# Patient Record
Sex: Male | Born: 1995 | Hispanic: No | Marital: Single
Health system: Northeastern US, Academic
[De-identification: ages and names within clinical notes are randomized; demographics above are authoritative.]

## PROBLEM LIST (undated history)

## (undated) DIAGNOSIS — K219 Gastro-esophageal reflux disease without esophagitis: Secondary | ICD-10-CM

---
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
## 2018-07-12 ENCOUNTER — Other Ambulatory Visit: Payer: Self-pay

## 2018-07-12 ENCOUNTER — Emergency Department: Payer: 59

## 2018-07-12 ENCOUNTER — Emergency Department
Admission: EM | Admit: 2018-07-12 | Discharge: 2018-07-12 | Disposition: A | Discharge status: Home / Self Care | Payer: 59 | Attending: Emergency Medicine | Admitting: Emergency Medicine

## 2018-07-12 ENCOUNTER — Encounter: Payer: Self-pay | Admitting: Emergency Medicine

## 2018-07-12 DIAGNOSIS — S8011XA Contusion of right lower leg, initial encounter: Secondary | ICD-10-CM | POA: Diagnosis not present

## 2018-07-12 DIAGNOSIS — S8991XA Unspecified injury of right lower leg, initial encounter: Secondary | ICD-10-CM

## 2018-07-12 DIAGNOSIS — Y929 Unspecified place or not applicable: Secondary | ICD-10-CM | POA: Diagnosis not present

## 2018-07-12 DIAGNOSIS — S90511A Abrasion, right ankle, initial encounter: Secondary | ICD-10-CM | POA: Insufficient documentation

## 2018-07-12 DIAGNOSIS — Y939 Activity, unspecified: Secondary | ICD-10-CM | POA: Diagnosis not present

## 2018-07-12 DIAGNOSIS — Z79899 Other long term (current) drug therapy: Secondary | ICD-10-CM | POA: Insufficient documentation

## 2018-07-12 DIAGNOSIS — W208XXA Other cause of strike by thrown, projected or falling object, initial encounter: Secondary | ICD-10-CM | POA: Insufficient documentation

## 2018-07-12 DIAGNOSIS — Y999 Unspecified external cause status: Secondary | ICD-10-CM | POA: Diagnosis not present

## 2018-07-12 DIAGNOSIS — F17210 Nicotine dependence, cigarettes, uncomplicated: Secondary | ICD-10-CM | POA: Diagnosis not present

## 2018-07-12 MED ORDER — BACITRACIN-NEOMYCIN-POLYMYXIN 400-5-5000 EX OINT
TOPICAL_OINTMENT | Freq: Once | CUTANEOUS | Status: AC
  Administered 2018-07-12: 1 via TOPICAL
  Filled 2018-07-12: qty 1

## 2018-07-12 MED ORDER — IBUPROFEN 600 MG PO TABS: 600.0000 mg | ORAL_TABLET | Freq: Four times a day (QID) | ORAL | 0 refills | Status: AC | PRN

## 2018-07-12 NOTE — ED Triage Notes (Signed)
Pt reports wood platform fell on leg this morning at 230 am.  Was drinking at time but denies LOC.  Increased pain with ambulation.  Abrasion to right ankle, foot, calf noted along with bruising and swelling.

## 2018-07-12 NOTE — Discharge Instructions (Signed)
There is nothing broken on your x-ray.  Please wear Ace wrap and use crutches over the weekend.  Follow-up with [facility] health clinic or primary care early next week for reevaluation.  You can take ibuprofen for pain.  Please apply Neosporin to abrasions and keep covered.

## 2018-07-12 NOTE — ED Notes (Signed)
Ace wrap and crutches given by tech.

## 2018-07-12 NOTE — ED Provider Notes (Signed)
[facility] Emergency Department Provider Note  ____________________________________________  Time seen: Approximately 12:17 PM  I have reviewed the triage vital signs and the nursing notes.   HISTORY  Chief Complaint Leg Injury    HPI Luis Stephenson is a 23 y.o. male that presents to the emergency department for evaluation of right ankle and calf pain after a wooden platform fell on his leg about 10 hours ago.  He has had pain with walking since.  Patient is a Consulting civil engineer at [employer] and is UTD on vaccinations.  No additional injuries.  No numbness, tingling.   History reviewed. No pertinent past medical history.  Patient Active Problem List   Diagnosis Date Noted  . Cough 06/22/2016  . Sinus congestion 06/22/2016    History reviewed. No pertinent surgical history.  Prior to Admission medications   Medication Sig Start Date End Date Taking? Authorizing Provider  ibuprofen (ADVIL,MOTRIN) 600 MG tablet Take 1 tablet (600 mg total) by mouth every 6 (six) hours as needed. 07/12/18   Enid Derry, PA-C  ranitidine (ZANTAC) 150 MG capsule Take 150 mg by mouth every evening.    [provider]    Allergies Patient has no known allergies.  History reviewed. No pertinent family history.  Social History Social History   Tobacco Use  . Smoking status: Light Tobacco Smoker    Types: Cigarettes  . Smokeless tobacco: Never Used  . Tobacco comment: social smoker  Substance Use Topics  . Alcohol use: Yes    Comment: every other weekend  . Drug use: Yes    Types: Marijuana     Review of Systems  Respiratory: No SOB. Gastrointestinal: No abdominal pain.  No nausea, no vomiting.  Musculoskeletal: Positive for leg and ankle pain. Skin: Negative for lacerations, ecchymosis. Positive for rash and abrasions.  Neurological: Negative for headaches, numbness or tingling   ____________________________________________   PHYSICAL EXAM:  VITAL SIGNS: ED  Triage Vitals [07/12/18 1140]  Enc Vitals Group     BP (!) 152/73     Pulse Rate (!) 103     Resp 16     Temp 97.9 F (36.6 C)     Temp Source Oral     SpO2 98 %     Weight 168 lb 8 oz (76.4 kg)     Height 5\' 11"  (1.803 m)     Head Circumference      Peak Flow      Pain Score 3     Pain Loc      Pain Edu?      Excl. in GC?      Constitutional: Alert and oriented. Well appearing and in no acute distress. Eyes: Conjunctivae are normal. PERRL. EOMI. Head: Atraumatic. ENT:      Ears:      Nose: No congestion/rhinnorhea.      Mouth/Throat: Mucous membranes are moist.  Neck: No stridor.   Cardiovascular: Normal rate, regular rhythm.  Good peripheral circulation. Respiratory: Normal respiratory effort without tachypnea or retractions. Lungs CTAB. Good air entry to the bases with no decreased or absent breath sounds. Musculoskeletal: Full range of motion to all extremities. No gross deformities appreciated. Negative thompson test.  Neurologic:  Normal speech and language. No gross focal neurologic deficits are appreciated.  Skin:  Skin is warm, dry. Abrasions and ecchymosis to distal calf.  Psychiatric: Mood and affect are normal. Speech and behavior are normal. Patient exhibits appropriate insight and judgement.   ____________________________________________   LABS (all  labs ordered are listed, but only abnormal results are displayed)  Labs Reviewed - No data to display ____________________________________________  EKG   ____________________________________________  RADIOLOGY Lexine Baton, personally viewed and evaluated these images (plain radiographs) as part of my medical decision making, as well as reviewing the written report by the radiologist.  Dg Tibia/fibula Right  Result Date: 07/12/2018 CLINICAL DATA:  Recent fall.  Posterior ankle pain. EXAM: RIGHT TIBIA AND FIBULA - 2 VIEW COMPARISON:  Right ankle 07/12/2018 FINDINGS: Right tibia and fibula are intact.  Ankle and knee are located. Mild irregularity along the plantar aspect of the calcaneus but no definite fracture. No focal soft tissue abnormality. IMPRESSION: No acute bone abnormality in the right lower leg. Electronically Signed   By: Richarda Overlie M.D.   On: 07/12/2018 13:01   Dg Ankle Complete Right  Result Date: 07/12/2018 CLINICAL DATA:  Status post fall with ankle pain. EXAM: RIGHT ANKLE - COMPLETE 3+ VIEW COMPARISON:  None. FINDINGS: There is no evidence of fracture, dislocation, or joint effusion. There is no evidence of arthropathy or other focal bone abnormality. Soft tissues are unremarkable. IMPRESSION: Negative. Electronically Signed   By: Ted Mcalpine M.D.   On: 07/12/2018 12:59    ____________________________________________    PROCEDURES  Procedure(s) performed:    Procedures    Medications  neomycin-bacitracin-polymyxin (NEOSPORIN) ointment packet (1 application Topical Given 07/12/18 1330)     ____________________________________________   INITIAL IMPRESSION / ASSESSMENT AND PLAN / ED COURSE  Pertinent labs & imaging results that were available during my care of the patient were reviewed by me and considered in my medical decision making (see chart for details).  Review of the [place] CSRS was performed in accordance of the [facility] prior to dispensing any controlled drugs.   Patient's diagnosis is consistent with patient presented to emergency department for evaluation of lower leg pain after injury last night.  Vital signs and exam are reassuring.  X-rays are negative for acute bony abnormalities.  Symptoms are consistent with contusion and abrasion.  Leg was Ace wrapped.  Crutches were given.  Patient will be discharged home with prescriptions for ibuprofen. Patient is to follow up with primary care as directed. Patient is given ED precautions to return to the ED for any worsening or new symptoms.     ____________________________________________  FINAL  CLINICAL IMPRESSION(S) / ED DIAGNOSES  Final diagnoses:  Injury of right lower extremity, initial encounter  Contusion of right lower leg, initial encounter      NEW MEDICATIONS STARTED DURING THIS VISIT:  ED Discharge Orders         Ordered    ibuprofen (ADVIL,MOTRIN) 600 MG tablet  Every 6 hours PRN     07/12/18 1321              This chart was dictated using voice recognition software/Dragon. Despite best efforts to proofread, errors can occur which can change the meaning. Any change was purely unintentional.    Enid Derry, PA-C 07/12/18 1437    Minna Antis, MD 07/12/18 [phone]

## 2018-10-28 ENCOUNTER — Emergency Department: Payer: 59

## 2018-10-28 ENCOUNTER — Encounter: Payer: Self-pay | Admitting: Emergency Medicine

## 2018-10-28 ENCOUNTER — Emergency Department
Admission: EM | Admit: 2018-10-28 | Discharge: 2018-10-28 | Disposition: A | Discharge status: Home / Self Care | Payer: 59 | Attending: Student in an Organized Health Care Education/Training Program | Admitting: Student in an Organized Health Care Education/Training Program

## 2018-10-28 ENCOUNTER — Other Ambulatory Visit: Payer: Self-pay

## 2018-10-28 DIAGNOSIS — F17210 Nicotine dependence, cigarettes, uncomplicated: Secondary | ICD-10-CM | POA: Insufficient documentation

## 2018-10-28 DIAGNOSIS — Z20828 Contact with and (suspected) exposure to other viral communicable diseases: Secondary | ICD-10-CM | POA: Insufficient documentation

## 2018-10-28 DIAGNOSIS — R0602 Shortness of breath: Secondary | ICD-10-CM | POA: Diagnosis not present

## 2018-10-28 DIAGNOSIS — R0789 Other chest pain: Secondary | ICD-10-CM | POA: Insufficient documentation

## 2018-10-28 DIAGNOSIS — Z79899 Other long term (current) drug therapy: Secondary | ICD-10-CM | POA: Diagnosis not present

## 2018-10-28 LAB — CBC WITH DIFFERENTIAL/PLATELET
Abs Immature Granulocytes: 0.01 10*3/uL (ref 0.00–0.07)
Basophils Absolute: 0 10*3/uL (ref 0.0–0.1)
Basophils Relative: 1 %
Eosinophils Absolute: 0 10*3/uL (ref 0.0–0.5)
Eosinophils Relative: 1 %
HCT: 46.2 % (ref 39.0–52.0)
Hemoglobin: 16 g/dL (ref 13.0–17.0)
Immature Granulocytes: 0 %
Lymphocytes Relative: 38 %
Lymphs Abs: 2.2 10*3/uL (ref 0.7–4.0)
MCH: 30.9 pg (ref 26.0–34.0)
MCHC: 34.6 g/dL (ref 30.0–36.0)
MCV: 89.4 fL (ref 80.0–100.0)
Monocytes Absolute: 0.5 10*3/uL (ref 0.1–1.0)
Monocytes Relative: 8 %
Neutro Abs: 3.1 10*3/uL (ref 1.7–7.7)
Neutrophils Relative %: 52 %
Platelets: 225 10*3/uL (ref 150–400)
RBC: 5.17 MIL/uL (ref 4.22–5.81)
RDW: 12.1 % (ref 11.5–15.5)
WBC: 5.9 10*3/uL (ref 4.0–10.5)
nRBC: 0 % (ref 0.0–0.2)

## 2018-10-28 LAB — COMPREHENSIVE METABOLIC PANEL
ALT: 45 U/L — ABNORMAL HIGH (ref 0–44)
AST: 35 U/L (ref 15–41)
Albumin: 4.5 g/dL (ref 3.5–5.0)
Alkaline Phosphatase: 55 U/L (ref 38–126)
Anion gap: 9 (ref 5–15)
BUN: 12 mg/dL (ref 6–20)
CO2: 23 mmol/L (ref 22–32)
Calcium: 9.3 mg/dL (ref 8.9–10.3)
Chloride: 107 mmol/L (ref 98–111)
Creatinine, Ser: 0.89 mg/dL (ref 0.61–1.24)
GFR calc Af Amer: >60 mL/min (ref >60)
GFR calc non Af Amer: >60 mL/min (ref >60)
Glucose, Bld: 92 mg/dL (ref 70–99)
Potassium: 3.6 mmol/L (ref 3.5–5.1)
Sodium: 139 mmol/L (ref 135–145)
Total Bilirubin: 1.1 mg/dL (ref 0.3–1.2)
Total Protein: 7.6 g/dL (ref 6.5–8.1)

## 2018-10-28 MED ORDER — PREDNISONE 20 MG PO TABS: 40.0000 mg | ORAL_TABLET | Freq: Every day | ORAL | 0 refills | Status: AC

## 2018-10-28 MED ORDER — PREDNISONE 20 MG PO TABS
60.0000 mg | ORAL_TABLET | Freq: Once | ORAL | Status: AC
  Administered 2018-10-28: 60 mg via ORAL
  Filled 2018-10-28: qty 3

## 2018-10-28 NOTE — ED Provider Notes (Signed)
[facility] Emergency Department Provider Note    First MD Initiated Contact with Patient 10/28/18 1355     (approximate)  I have reviewed the triage vital signs and the nursing notes.   HISTORY  Chief Complaint Shortness of Breath    HPI Luis Stephenson is a 23 y.o. male presents ER for evaluation of left-sided chest discomfort and shortness of breath that is been ongoing for quite some time but became acutely worse today.  States he does have a history of asthma and took albuterol inhalers as a child.  He does vape and smoke.  Denies any recent prolonged travel or immobilization.  No history of blood clots.  No fevers.  Has had some loose stool.  Would like to be tested for coronavirus.    History reviewed. No pertinent past medical history. No family history on file. History reviewed. No pertinent surgical history. Patient Active Problem List   Diagnosis Date Noted  . Cough 06/22/2016  . Sinus congestion 06/22/2016      Prior to Admission medications   Medication Sig Start Date End Date Taking? Authorizing Provider  ibuprofen (ADVIL,MOTRIN) 600 MG tablet Take 1 tablet (600 mg total) by mouth every 6 (six) hours as needed. 07/12/18   Enid DerryWagner, Ashley, PA-C  predniSONE (DELTASONE) 20 MG tablet Take 2 tablets (40 mg total) by mouth daily for 5 days. 10/28/18 11/02/18  Willy Eddyobinson, Lakyn Alsteen, MD  ranitidine (ZANTAC) 150 MG capsule Take 150 mg by mouth every evening.    [provider]    Allergies Patient has no known allergies.    Social History Social History   Tobacco Use  . Smoking status: Light Tobacco Smoker    Types: Cigarettes  . Smokeless tobacco: Never Used  . Tobacco comment: social smoker  Substance Use Topics  . Alcohol use: Yes    Comment: every other weekend  . Drug use: Yes    Types: Marijuana    Review of Systems Patient denies headaches, rhinorrhea, blurry vision, numbness, shortness of breath, chest pain, edema, cough,  abdominal pain, nausea, vomiting, diarrhea, dysuria, fevers, rashes or hallucinations unless otherwise stated above in HPI. ____________________________________________   PHYSICAL EXAM:  VITAL SIGNS: Vitals:   10/28/18 1353  BP: (!) 129/96  Pulse: 83  Resp: 16  Temp: 98.2 F (36.8 C)  SpO2: 96%    Constitutional: Alert and oriented.  Eyes: Conjunctivae are normal.  Head: Atraumatic. Nose: No congestion/rhinnorhea. Mouth/Throat: Mucous membranes are moist.   Neck: No stridor. Painless ROM.  Cardiovascular: Normal rate, regular rhythm. Grossly normal heart sounds.  Good peripheral circulation. Respiratory: Normal respiratory effort.  No retractions. Lungs with occasional expiratory wheeze. Gastrointestinal: Soft and nontender. No distention. No abdominal bruits. No CVA tenderness. Genitourinary:  Musculoskeletal: No lower extremity tenderness nor edema.  No joint effusions. Neurologic:  Normal speech and language. No gross focal neurologic deficits are appreciated. No facial droop Skin:  Skin is warm, dry and intact. No rash noted. Psychiatric: Mood and affect are normal. Speech and behavior are normal.  ____________________________________________   LABS (all labs ordered are listed, but only abnormal results are displayed)  Results for orders placed or performed during the hospital encounter of 10/28/18 (from the past 24 hour(s))  CBC with Differential/Platelet     Status: None   Collection Time: 10/28/18  2:30 PM  Result Value Ref Range   WBC 5.9 4.0 - 10.5 K/uL   RBC 5.17 4.22 - 5.81 MIL/uL   Hemoglobin 16.0 13.0 -  17.0 g/dL   HCT 46.2 39.0 - 52.0 %   MCV 89.4 80.0 - 100.0 fL   MCH 30.9 26.0 - 34.0 pg   MCHC 34.6 30.0 - 36.0 g/dL   RDW 12.1 11.5 - 15.5 %   Platelets 225 150 - 400 K/uL   nRBC 0.0 0.0 - 0.2 %   Neutrophils Relative % 52 %   Neutro Abs 3.1 1.7 - 7.7 K/uL   Lymphocytes Relative 38 %   Lymphs Abs 2.2 0.7 - 4.0 K/uL   Monocytes Relative 8 %    Monocytes Absolute 0.5 0.1 - 1.0 K/uL   Eosinophils Relative 1 %   Eosinophils Absolute 0.0 0.0 - 0.5 K/uL   Basophils Relative 1 %   Basophils Absolute 0.0 0.0 - 0.1 K/uL   Immature Granulocytes 0 %   Abs Immature Granulocytes 0.01 0.00 - 0.07 K/uL  Comprehensive metabolic panel     Status: Abnormal   Collection Time: 10/28/18  2:30 PM  Result Value Ref Range   Sodium 139 135 - 145 mmol/L   Potassium 3.6 3.5 - 5.1 mmol/L   Chloride 107 98 - 111 mmol/L   CO2 23 22 - 32 mmol/L   Glucose, Bld 92 70 - 99 mg/dL   BUN 12 6 - 20 mg/dL   Creatinine, Ser 0.89 0.61 - 1.24 mg/dL   Calcium 9.3 8.9 - 10.3 mg/dL   Total Protein 7.6 6.5 - 8.1 g/dL   Albumin 4.5 3.5 - 5.0 g/dL   AST 35 15 - 41 U/L   ALT 45 (H) 0 - 44 U/L   Alkaline Phosphatase 55 38 - 126 U/L   Total Bilirubin 1.1 0.3 - 1.2 mg/dL   GFR calc non Af Amer >60 >60 mL/min   GFR calc Af Amer >60 >60 mL/min   Anion gap 9 5 - 15   ____________________________________________ ____________________________________________  RADIOLOGY  I personally reviewed all radiographic images ordered to evaluate for the above acute complaints and reviewed radiology reports and findings.  These findings were personally discussed with the patient.  Please see medical record for radiology report.  ____________________________________________   PROCEDURES  Procedure(s) performed:  Procedures    Critical Care performed: no ____________________________________________   INITIAL IMPRESSION / ASSESSMENT AND PLAN / ED COURSE  Pertinent labs & imaging results that were available during my care of the patient were reviewed by me and considered in my medical decision making (see chart for details).   DDX: Asthma, bronchitis, PE, pneumothorax, pneumonia, COVID-19, pleurisy  Luis Stephenson is a 23 y.o. who presents to the ED with symptoms as described above.  Patient well-appearing afebrile hemodynamically stable.  No hypoxia.  He is low risk by  Wells criteria and is PERC negative.  Patient blood will be sent to evaluate for any sign of anemia.  Will be treated for bronchitis.  Will also send some of her COVID-19.  Patient stable and appropriate for outpatient follow-up.     The patient was evaluated in Emergency Department today for the symptoms described in the history of present illness. He/she was evaluated in the context of the global COVID-19 pandemic, which necessitated consideration that the patient might be at risk for infection with the SARS-CoV-2 virus that causes COVID-19. Institutional protocols and algorithms that pertain to the evaluation of patients at risk for COVID-19 are in a state of rapid change based on information released by regulatory bodies including the CDC and federal and state organizations. These policies and algorithms were  followed during the patient's care in the ED.  As part of my medical decision making, I reviewed the following data within the electronic MEDICAL RECORD NUMBER Nursing notes reviewed and incorporated, Labs reviewed, notes from prior ED visits and [place] Controlled Substance Database   ____________________________________________   FINAL CLINICAL IMPRESSION(S) / ED DIAGNOSES  Final diagnoses:  SOB (shortness of breath)      NEW MEDICATIONS STARTED DURING THIS VISIT:  New Prescriptions   PREDNISONE (DELTASONE) 20 MG TABLET    Take 2 tablets (40 mg total) by mouth daily for 5 days.     Note:  This document was prepared using Dragon voice recognition software and may include unintentional dictation errors.    Willy Eddyobinson, Nyla Creason, MD 10/28/18 1530

## 2018-10-28 NOTE — ED Triage Notes (Signed)
Pt arrives with complaints of shortness of breath for the last 4 days. Pt reports back pain but reports "it's only there when I breath."

## 2018-10-28 NOTE — Discharge Instructions (Signed)
Your blood work all returned normal.  Your chest x-ray shows no evidence of pneumonia.  Your coronavirus test showed result in 1 to 2 days.  Please take prednisone as prescribed and use your albuterol inhaler every 4-6 hours while awake for the next 3 days.  Return for any additional questions or concerns.  He can also take naproxen or Aleve to help with the pain and discomfort.

## 2018-10-29 ENCOUNTER — Telehealth: Payer: Self-pay | Admitting: Emergency Medicine

## 2018-10-29 LAB — NOVEL CORONAVIRUS, NAA (HOSP ORDER, SEND-OUT TO REF LAB; TAT 18-24 HRS): SARS-CoV-2, NAA: NOT DETECTED

## 2018-10-29 NOTE — Telephone Encounter (Signed)
Patient returned call for COVID results to [facility]- results given because they were negative.

## 2018-10-29 NOTE — Telephone Encounter (Addendum)
Called patient to inform of covid 19 result.  No answer and no voicemail.  Tried to call again without contacting.

## 2019-01-06 ENCOUNTER — Other Ambulatory Visit: Payer: Self-pay

## 2019-01-06 ENCOUNTER — Encounter: Payer: Self-pay | Admitting: *Deleted

## 2019-01-06 ENCOUNTER — Emergency Department
Admission: EM | Admit: 2019-01-06 | Discharge: 2019-01-06 | Disposition: A | Discharge status: Home / Self Care | Payer: 59 | Attending: Emergency Medicine | Admitting: Emergency Medicine

## 2019-01-06 DIAGNOSIS — F17210 Nicotine dependence, cigarettes, uncomplicated: Secondary | ICD-10-CM | POA: Insufficient documentation

## 2019-01-06 DIAGNOSIS — R5383 Other fatigue: Secondary | ICD-10-CM

## 2019-01-06 DIAGNOSIS — R252 Cramp and spasm: Secondary | ICD-10-CM

## 2019-01-06 DIAGNOSIS — Z20828 Contact with and (suspected) exposure to other viral communicable diseases: Secondary | ICD-10-CM | POA: Diagnosis not present

## 2019-01-06 HISTORY — DX: Gastro-esophageal reflux disease without esophagitis: K21.9

## 2019-01-06 LAB — BASIC METABOLIC PANEL
Anion gap: 8 (ref 5–15)
BUN: 12 mg/dL (ref 6–20)
CO2: 26 mmol/L (ref 22–32)
Calcium: 9.5 mg/dL (ref 8.9–10.3)
Chloride: 103 mmol/L (ref 98–111)
Creatinine, Ser: 0.85 mg/dL (ref 0.61–1.24)
GFR calc Af Amer: >60 mL/min (ref >60)
GFR calc non Af Amer: >60 mL/min (ref >60)
Glucose, Bld: 124 mg/dL — ABNORMAL HIGH (ref 70–99)
Potassium: 3.7 mmol/L (ref 3.5–5.1)
Sodium: 137 mmol/L (ref 135–145)

## 2019-01-06 LAB — URINALYSIS, COMPLETE (UACMP) WITH MICROSCOPIC
Bacteria, UA: NONE SEEN
Bilirubin Urine: NEGATIVE
Glucose, UA: NEGATIVE mg/dL
Hgb urine dipstick: NEGATIVE
Ketones, ur: NEGATIVE mg/dL
Leukocytes,Ua: NEGATIVE
Nitrite: NEGATIVE
Protein, ur: NEGATIVE mg/dL
Specific Gravity, Urine: 1.02 (ref 1.005–1.030)
Squamous Epithelial / HPF: NONE SEEN (ref 0–5)
pH: 7 (ref 5.0–8.0)

## 2019-01-06 LAB — CBC
HCT: 46.5 % (ref 39.0–52.0)
Hemoglobin: 16 g/dL (ref 13.0–17.0)
MCH: 30.7 pg (ref 26.0–34.0)
MCHC: 34.4 g/dL (ref 30.0–36.0)
MCV: 89.1 fL (ref 80.0–100.0)
Platelets: 253 10*3/uL (ref 150–400)
RBC: 5.22 MIL/uL (ref 4.22–5.81)
RDW: 13 % (ref 11.5–15.5)
WBC: 6.4 10*3/uL (ref 4.0–10.5)
nRBC: 0 % (ref 0.0–0.2)

## 2019-01-06 LAB — CK: Total CK: 124 U/L (ref 49–397)

## 2019-01-06 MED ORDER — KETOROLAC TROMETHAMINE 60 MG/2ML IM SOLN
30.0000 mg | Freq: Once | INTRAMUSCULAR | Status: AC
  Administered 2019-01-06: 17:00:00 30 mg via INTRAMUSCULAR
  Filled 2019-01-06: qty 2

## 2019-01-06 NOTE — ED Provider Notes (Signed)
[facility] Emergency Department Provider Note   ____________________________________________   First MD Initiated Contact with Patient 01/06/19 1614     (approximate)  I have reviewed the triage vital signs and the nursing notes.   HISTORY  Chief Complaint Leg cramping    HPI Luis Stephenson is a 23 y.o. male with no significant past medical history who presents to the ED complaining of leg pain and fatigue.  Patient reports he has had gradually worsening achy pain in his bilateral lower extremities, which initially started in both eyes and is now extended down into his calves.  He has not noticed any swelling in either extremity and denies any recent surgeries or long car trips, does not have a history of DVT/PE.  He does admit to going on a hiking trip last week prior to onset of symptoms.  He is also concerned about COVID-19, states he has been feeling generally fatigued and his friend had similar symptoms prior to testing positive.  He denies any fevers, cough, chest pain, or shortness of breath.        Past Medical History:  Diagnosis Date   GERD (gastroesophageal reflux disease)     Patient Active Problem List   Diagnosis Date Noted   Cough 06/22/2016   Sinus congestion 06/22/2016    History reviewed. No pertinent surgical history.  Prior to Admission medications   Medication Sig Start Date End Date Taking? Authorizing Provider  ibuprofen (ADVIL,MOTRIN) 600 MG tablet Take 1 tablet (600 mg total) by mouth every 6 (six) hours as needed. 07/12/18   Enid DerryWagner, Ashley, PA-C  ranitidine (ZANTAC) 150 MG capsule Take 150 mg by mouth every evening.    [provider]    Allergies Patient has no known allergies.  History reviewed. No pertinent family history.  Social History Social History   Tobacco Use   Smoking status: Current Every Day Smoker    Types: E-cigarettes   Smokeless tobacco: Never Used   Tobacco comment: social smoker   Substance Use Topics   Alcohol use: Yes    Alcohol/week: 8.0 standard drinks    Types: 8 Cans of beer per week    Comment: Everyday   Drug use: Yes    Types: Marijuana    Review of Systems  Constitutional: No fever/chills.  Positive for fatigue. Eyes: No visual changes. ENT: No sore throat. Cardiovascular: Denies chest pain. Respiratory: Denies shortness of breath. Gastrointestinal: No abdominal pain.  No nausea, no vomiting.  No diarrhea.  No constipation. Genitourinary: Negative for dysuria. Musculoskeletal: Negative for back pain.  Positive for lower extremity crampy pain. Skin: Negative for rash. Neurological: Negative for headaches, focal weakness or numbness.  ____________________________________________   PHYSICAL EXAM:  VITAL SIGNS: ED Triage Vitals  Enc Vitals Group     BP 01/06/19 1532 138/72     Pulse Rate 01/06/19 1532 (!) 103     Resp 01/06/19 1532 16     Temp 01/06/19 1532 98.7 F (37.1 C)     Temp Source 01/06/19 1532 Oral     SpO2 01/06/19 1532 99 %     Weight 01/06/19 1533 172 lb (78 kg)     Height 01/06/19 1533 5\' 11"  (1.803 m)     Head Circumference --      Peak Flow --      Pain Score 01/06/19 1532 6     Pain Loc --      Pain Edu? --      Excl. in  GC? --     Constitutional: Alert and oriented. Eyes: Conjunctivae are normal. Head: Atraumatic. Nose: No congestion/rhinnorhea. Mouth/Throat: Mucous membranes are moist. Neck: Normal ROM Cardiovascular: Normal rate, regular rhythm. Grossly normal heart sounds. Respiratory: Normal respiratory effort.  No retractions. Lungs CTAB. Gastrointestinal: Soft and nontender. No distention. Genitourinary: deferred Musculoskeletal: Diffuse muscular tenderness to bilateral thighs and calves, no associated edema, warmth, or erythema.  2+ DP pulses bilaterally. Neurologic:  Normal speech and language. No gross focal neurologic deficits are appreciated.  Strength 5 out of 5 in bilateral lower  extremities. Skin:  Skin is warm, dry and intact. No rash noted. Psychiatric: Mood and affect are normal. Speech and behavior are normal.  ____________________________________________   LABS (all labs ordered are listed, but only abnormal results are displayed)  Labs Reviewed  BASIC METABOLIC PANEL - Abnormal; Notable for the following components:      Result Value   Glucose, Bld 124 (*)    All other components within normal limits  URINALYSIS, COMPLETE (UACMP) WITH MICROSCOPIC - Abnormal; Notable for the following components:   Color, Urine YELLOW (*)    APPearance CLEAR (*)    All other components within normal limits  SARS CORONAVIRUS 2  CBC  CK     PROCEDURES  Procedure(s) performed (including Critical Care):  Procedures   ____________________________________________   INITIAL IMPRESSION / ASSESSMENT AND PLAN / ED COURSE       Previously healthy 23 year old male presenting to the ED with bilateral lower extremity crampy pain as well as generalized fatigue for the past few days.  Lower extremities are warm and well perfused, do not suspect DVT as symptoms are bilateral with no associated edema.  He also has no apparent risk factors for DVT.  Will test for COVID-19 given his concerns and possible exposure.  Labs within normal limits, no evidence of rhabdomyolysis.  Will treat symptomatically with NSAIDs, counseled to follow-up with PCP and return to the ED for new or worsening symptoms.  Patient agrees with plan.      ____________________________________________   FINAL CLINICAL IMPRESSION(S) / ED DIAGNOSES  Final diagnoses:  Muscle cramps  Fatigue, unspecified type     ED Discharge Orders    None       Note:  This document was prepared using Dragon voice recognition software and may include unintentional dictation errors.   Blake Divine, MD 01/06/19 [phone]

## 2019-01-06 NOTE — ED Triage Notes (Signed)
Pt reporting bilateral leg cramping and leg fatigue starting three days ago. No increase in activity. No injury. Urine is not dark in color.

## 2019-01-06 NOTE — ED Notes (Signed)
Pt ambulatory from triage with steady gait, pt c/o "leg fatigue" in both legs below the waist x 3-4 days, pt in NAD at this time, A&Ox4.

## 2019-01-06 NOTE — ED Notes (Signed)
Dr Jessup at bedside 

## 2019-01-07 LAB — SARS CORONAVIRUS 2 (TAT 6-24 HRS): SARS Coronavirus 2: NEGATIVE

## 2019-01-14 ENCOUNTER — Other Ambulatory Visit: Payer: Self-pay

## 2019-01-14 DIAGNOSIS — Z20822 Contact with and (suspected) exposure to covid-19: Secondary | ICD-10-CM

## 2019-01-15 ENCOUNTER — Telehealth: Payer: Self-pay | Admitting: *Deleted

## 2019-01-15 LAB — SPECIMEN STATUS REPORT

## 2019-01-15 LAB — NOVEL CORONAVIRUS, NAA: SARS-CoV-2, NAA: NOT DETECTED

## 2019-01-15 NOTE — Telephone Encounter (Signed)
Pt called in requesting his COVID-19 test result.    I let him know it was negative.  He thanked me very much for the good news.

## 2019-02-24 ENCOUNTER — Other Ambulatory Visit: Payer: Self-pay

## 2019-02-24 DIAGNOSIS — Z20822 Contact with and (suspected) exposure to covid-19: Secondary | ICD-10-CM

## 2019-02-26 LAB — NOVEL CORONAVIRUS, NAA: SARS-CoV-2, NAA: NOT DETECTED

## 2019-08-17 ENCOUNTER — Ambulatory Visit: Payer: 59 | Attending: Internal Medicine

## 2019-08-17 DIAGNOSIS — Z23 Encounter for immunization: Secondary | ICD-10-CM

## 2019-08-17 NOTE — Progress Notes (Signed)
   Covid-19 Vaccination Clinic  Name:  Luis Stephenson    MRN: [id number] DOB: Jul 04, 1995  08/17/2019  [name] was observed post Covid-19 immunization for 15 minutes without incident. He was provided with Vaccine Information Sheet and instruction to access the V-Safe system.   [name] was instructed to call 911 with any severe reactions post vaccine: Marland Kitchen Difficulty breathing  . Swelling of face and throat  . A fast heartbeat  . A bad rash all over body  . Dizziness and weakness   Immunizations Administered    Name Date Dose VIS Date Route   Pfizer COVID-19 Vaccine 08/17/2019  2:15 PM 0.3 mL 05/01/2019 Intramuscular   Manufacturer: [employer], [employer]   Lot: [id number]   NDC: [address]-0

## 2019-08-27 ENCOUNTER — Encounter: Payer: Self-pay | Admitting: Emergency Medicine

## 2019-08-27 ENCOUNTER — Emergency Department: Payer: 59

## 2019-08-27 ENCOUNTER — Emergency Department
Admission: EM | Admit: 2019-08-27 | Discharge: 2019-08-27 | Disposition: A | Discharge status: Home / Self Care | Payer: 59 | Attending: Emergency Medicine | Admitting: Emergency Medicine

## 2019-08-27 ENCOUNTER — Other Ambulatory Visit: Payer: Self-pay

## 2019-08-27 DIAGNOSIS — R197 Diarrhea, unspecified: Secondary | ICD-10-CM

## 2019-08-27 DIAGNOSIS — F10929 Alcohol use, unspecified with intoxication, unspecified: Secondary | ICD-10-CM | POA: Insufficient documentation

## 2019-08-27 DIAGNOSIS — F17290 Nicotine dependence, other tobacco product, uncomplicated: Secondary | ICD-10-CM | POA: Diagnosis not present

## 2019-08-27 DIAGNOSIS — R1030 Lower abdominal pain, unspecified: Secondary | ICD-10-CM | POA: Diagnosis not present

## 2019-08-27 DIAGNOSIS — Z7289 Other problems related to lifestyle: Secondary | ICD-10-CM

## 2019-08-27 DIAGNOSIS — Z789 Other specified health status: Secondary | ICD-10-CM

## 2019-08-27 DIAGNOSIS — Z79899 Other long term (current) drug therapy: Secondary | ICD-10-CM | POA: Insufficient documentation

## 2019-08-27 LAB — COMPREHENSIVE METABOLIC PANEL
ALT: 60 U/L — ABNORMAL HIGH (ref 0–44)
AST: 46 U/L — ABNORMAL HIGH (ref 15–41)
Albumin: 4.4 g/dL (ref 3.5–5.0)
Alkaline Phosphatase: 90 U/L (ref 38–126)
Anion gap: 10 (ref 5–15)
BUN: 11 mg/dL (ref 6–20)
CO2: 25 mmol/L (ref 22–32)
Calcium: 9.2 mg/dL (ref 8.9–10.3)
Chloride: 104 mmol/L (ref 98–111)
Creatinine, Ser: 1.05 mg/dL (ref 0.61–1.24)
GFR calc Af Amer: >60 mL/min (ref >60)
GFR calc non Af Amer: >60 mL/min (ref >60)
Glucose, Bld: 104 mg/dL — ABNORMAL HIGH (ref 70–99)
Potassium: 3.9 mmol/L (ref 3.5–5.1)
Sodium: 139 mmol/L (ref 135–145)
Total Bilirubin: 0.7 mg/dL (ref 0.3–1.2)
Total Protein: 7.5 g/dL (ref 6.5–8.1)

## 2019-08-27 LAB — URINALYSIS, COMPLETE (UACMP) WITH MICROSCOPIC
Bacteria, UA: NONE SEEN
Bilirubin Urine: NEGATIVE
Glucose, UA: NEGATIVE mg/dL
Hgb urine dipstick: NEGATIVE
Ketones, ur: NEGATIVE mg/dL
Leukocytes,Ua: NEGATIVE
Nitrite: NEGATIVE
Protein, ur: NEGATIVE mg/dL
Specific Gravity, Urine: 1.021 (ref 1.005–1.030)
Squamous Epithelial / HPF: NONE SEEN (ref 0–5)
pH: 6 (ref 5.0–8.0)

## 2019-08-27 LAB — CBC
HCT: 45.8 % (ref 39.0–52.0)
Hemoglobin: 15.9 g/dL (ref 13.0–17.0)
MCH: 31.5 pg (ref 26.0–34.0)
MCHC: 34.7 g/dL (ref 30.0–36.0)
MCV: 90.7 fL (ref 80.0–100.0)
Platelets: 269 10*3/uL (ref 150–400)
RBC: 5.05 MIL/uL (ref 4.22–5.81)
RDW: 12 % (ref 11.5–15.5)
WBC: 7.2 10*3/uL (ref 4.0–10.5)
nRBC: 0 % (ref 0.0–0.2)

## 2019-08-27 LAB — LIPASE, BLOOD: Lipase: 37 U/L (ref 11–51)

## 2019-08-27 MED ORDER — DICYCLOMINE HCL 10 MG PO CAPS: 10.0000 mg | ORAL_CAPSULE | Freq: Four times a day (QID) | ORAL | 0 refills | Status: AC

## 2019-08-27 NOTE — ED Provider Notes (Signed)
[facility] Emergency Department Provider Note  ____________________________________________  Time seen: Approximately 9:43 AM  I have reviewed the triage vital signs and the nursing notes.   HISTORY  Chief Complaint Abdominal Pain    HPI Luis Stephenson is a 24 y.o. male that presents to the emergency department for evaluation of lower abdominal cramping and diarrhea for 6 days.  Patient states that he had vomiting with diarrhea and abdominal cramping starting about 4 AM while at [facility] last Friday night.  Patient continued to have diarrhea the next day.  He did not have any further vomiting.  He still have the sensation that maybe he had stool in his intestines so started taking MiraLAX.  He has taken MiraLAX 3 times this week.  He drinks alcohol about 4 times a week.  He had 6 beers last night.  He has a history of GERD and does not take any medications for this.  No fevers, vomiting.  Past Medical History:  Diagnosis Date  . GERD (gastroesophageal reflux disease)     Patient Active Problem List   Diagnosis Date Noted  . Cough 06/22/2016  . Sinus congestion 06/22/2016    History reviewed. No pertinent surgical history.  Prior to Admission medications   Medication Sig Start Date End Date Taking? Authorizing Provider  dicyclomine (BENTYL) 10 MG capsule Take 1 capsule (10 mg total) by mouth 4 (four) times daily for 14 days. 08/27/19 09/10/19  Enid Derry, PA-C  ibuprofen (ADVIL,MOTRIN) 600 MG tablet Take 1 tablet (600 mg total) by mouth every 6 (six) hours as needed. 07/12/18   Enid Derry, PA-C  ranitidine (ZANTAC) 150 MG capsule Take 150 mg by mouth every evening.    [provider]    Allergies Patient has no known allergies.  History reviewed. No pertinent family history.  Social History Social History   Tobacco Use  . Smoking status: Current Every Day Smoker    Types: E-cigarettes  . Smokeless tobacco: Never Used  . Tobacco  comment: social smoker  Substance Use Topics  . Alcohol use: Yes    Alcohol/week: 8.0 standard drinks    Types: 8 Cans of beer per week    Comment: Everyday  . Drug use: Yes    Types: Marijuana     Review of Systems  Constitutional: No fever/chills ENT: No upper respiratory complaints. Cardiovascular: No chest pain. Respiratory: No cough. No SOB. Gastrointestinal: Positive for abdominal pain.  No nausea, no vomiting. Positive for diarrhea. Genitourinary: Negative for dysuria. Musculoskeletal: Negative for musculoskeletal pain. Skin: Negative for rash, abrasions, lacerations, ecchymosis. Neurological: Negative for headaches, numbness or tingling   ____________________________________________   PHYSICAL EXAM:  VITAL SIGNS: ED Triage Vitals  Enc Vitals Group     BP 08/27/19 0733 128/73     Pulse Rate 08/27/19 0733 78     Resp 08/27/19 0733 14     Temp 08/27/19 0733 98.1 F (36.7 C)     Temp Source 08/27/19 0733 Oral     SpO2 08/27/19 0733 97 %     Weight 08/27/19 0734 190 lb (86.2 kg)     Height 08/27/19 0734 5\' 11"  (1.803 m)     Head Circumference --      Peak Flow --      Pain Score 08/27/19 0733 6     Pain Loc --      Pain Edu? --      Excl. in GC? --      Constitutional: Alert  and oriented. Well appearing and in no acute distress. Working on his laptop. Eyes: Conjunctivae are normal. PERRL. EOMI. Head: Atraumatic. ENT:      Ears:      Nose: No congestion/rhinnorhea.      Mouth/Throat: Mucous membranes are moist.  Neck: No stridor.   Cardiovascular: Normal rate, regular rhythm.  Good peripheral circulation. Respiratory: Normal respiratory effort without tachypnea or retractions. Lungs CTAB. Good air entry to the bases with no decreased or absent breath sounds. Gastrointestinal: Bowel sounds 4 quadrants. Soft and nontender to palpation. No guarding or rigidity. No palpable masses. No distention.  Musculoskeletal: Full range of motion to all extremities. No  gross deformities appreciated. Neurologic:  Normal speech and language. No gross focal neurologic deficits are appreciated.  Skin:  Skin is warm, dry and intact. No rash noted. Psychiatric: Mood and affect are normal. Speech and behavior are normal. Patient exhibits appropriate insight and judgement.   ____________________________________________   LABS (all labs ordered are listed, but only abnormal results are displayed)  Labs Reviewed  COMPREHENSIVE METABOLIC PANEL - Abnormal; Notable for the following components:      Result Value   Glucose, Bld 104 (*)    AST 46 (*)    ALT 60 (*)    All other components within normal limits  URINALYSIS, COMPLETE (UACMP) WITH MICROSCOPIC - Abnormal; Notable for the following components:   Color, Urine YELLOW (*)    APPearance CLEAR (*)    All other components within normal limits  LIPASE, BLOOD  CBC   ____________________________________________  EKG   ____________________________________________  RADIOLOGY Robinette Haines, personally viewed and evaluated these images (plain radiographs) as part of my medical decision making, as well as reviewing the written report by the radiologist.  DG Abdomen 1 View  Result Date: 08/27/2019 CLINICAL DATA:  Constipation, abdominal pain EXAM: ABDOMEN - 1 VIEW COMPARISON:  04/02/2016 FINDINGS: The bowel gas pattern is normal. Mild colonic stool volume. No radio-opaque calculi or other significant radiographic abnormality are seen. Osseous structures unremarkable. IMPRESSION: Nonobstructive bowel gas pattern. Mild colonic stool volume. Electronically Signed   By: Davina Poke D.O.   On: 08/27/2019 08:32    ____________________________________________    PROCEDURES  Procedure(s) performed:    Procedures    Medications - No data to display   ____________________________________________   INITIAL IMPRESSION / ASSESSMENT AND PLAN / ED COURSE  Pertinent labs & imaging results that were  available during my care of the patient were reviewed by me and considered in my medical decision making (see chart for details).  Review of the [place] CSRS was performed in accordance of the [place] prior to dispensing any controlled drugs.   Patient presented to emergency department for evaluation of diarrhea and abdominal cramping for 6 days.  Vital signs and exam are reassuring.  Lab work notable for AST 46 and ALT 60.  This is likely due to patient's alcohol intake last night.  No acute abnormalities on KUB.  I suspect that patient has continued to have diarrhea all week because he has been drinking heavily several times this week and has continued to take MiraLAX this week.  Patient will discontinue MiraLAX.  He will drink Gatorade and water today and will refrain from alcohol for a couple of days.  Patient will be discharged home with prescriptions for Bentyl. Patient is to follow up with primary care as directed. Patient is given ED precautions to return to the ED for any worsening or new symptoms.  Meer Reindl was evaluated in Emergency Department on 08/27/2019 for the symptoms described in the history of present illness. He was evaluated in the context of the global COVID-19 pandemic, which necessitated consideration that the patient might be at risk for infection with the SARS-CoV-2 virus that causes COVID-19. Institutional protocols and algorithms that pertain to the evaluation of patients at risk for COVID-19 are in a state of rapid change based on information released by regulatory bodies including the CDC and federal and state organizations. These policies and algorithms were followed during the patient's care in the ED.   ____________________________________________  FINAL CLINICAL IMPRESSION(S) / ED DIAGNOSES  Final diagnoses:  Alcohol use  Diarrhea, unspecified type      NEW MEDICATIONS STARTED DURING THIS VISIT:  ED Discharge Orders         Ordered    dicyclomine (BENTYL) 10 MG  capsule  4 times daily     08/27/19 1009              This chart was dictated using voice recognition software/Dragon. Despite best efforts to proofread, errors can occur which can change the meaning. Any change was purely unintentional.    Enid Derry, PA-C 08/27/19 1506    Sharman Cheek, MD 08/27/19 [phone]

## 2019-08-27 NOTE — ED Triage Notes (Signed)
Pt here for lower abdominal pain. Thinks has been constipated, took Miramax X 3 in last week with diarrhea since but feels like still has stool stuck in him.  No vomiting. No fever. NAD. VSS

## 2019-08-27 NOTE — ED Notes (Signed)
See triage note  Presents with some generalized abd discomfort States discomfort started 1 week ago  No fever  States he has had intermittent diarrhea and then some constipation

## 2019-09-07 ENCOUNTER — Ambulatory Visit: Payer: 59 | Attending: Internal Medicine

## 2019-09-07 DIAGNOSIS — Z23 Encounter for immunization: Secondary | ICD-10-CM

## 2019-09-07 NOTE — Progress Notes (Signed)
   Covid-19 Vaccination Clinic  Name:  Luis Stephenson    MRN: [id number] DOB: [birth year]  09/07/2019  [name] was observed post Covid-19 immunization for 15 minutes without incident. He was provided with Vaccine Information Sheet and instruction to access the V-Safe system.   [name] was instructed to call 911 with any severe reactions post vaccine: Marland Kitchen Difficulty breathing  . Swelling of face and throat  . A fast heartbeat  . A bad rash all over body  . Dizziness and weakness   Immunizations Administered    Name Date Dose VIS Date Route   Pfizer COVID-19 Vaccine 09/07/2019  1:43 PM 0.3 mL 07/15/2018 Intramuscular   Manufacturer: [employer], [employer]   Lot: [id number]   NDC: [address]-1

## 2020-06-14 IMAGING — CR DG ABDOMEN 1V
2 series · 2 of 2 positions shown · non-contrast
Comparison: 04/02/2016

CLINICAL DATA: Constipation, abdominal pain

EXAM:
ABDOMEN - 1 VIEW

[abdomen kub (1 of 2)]
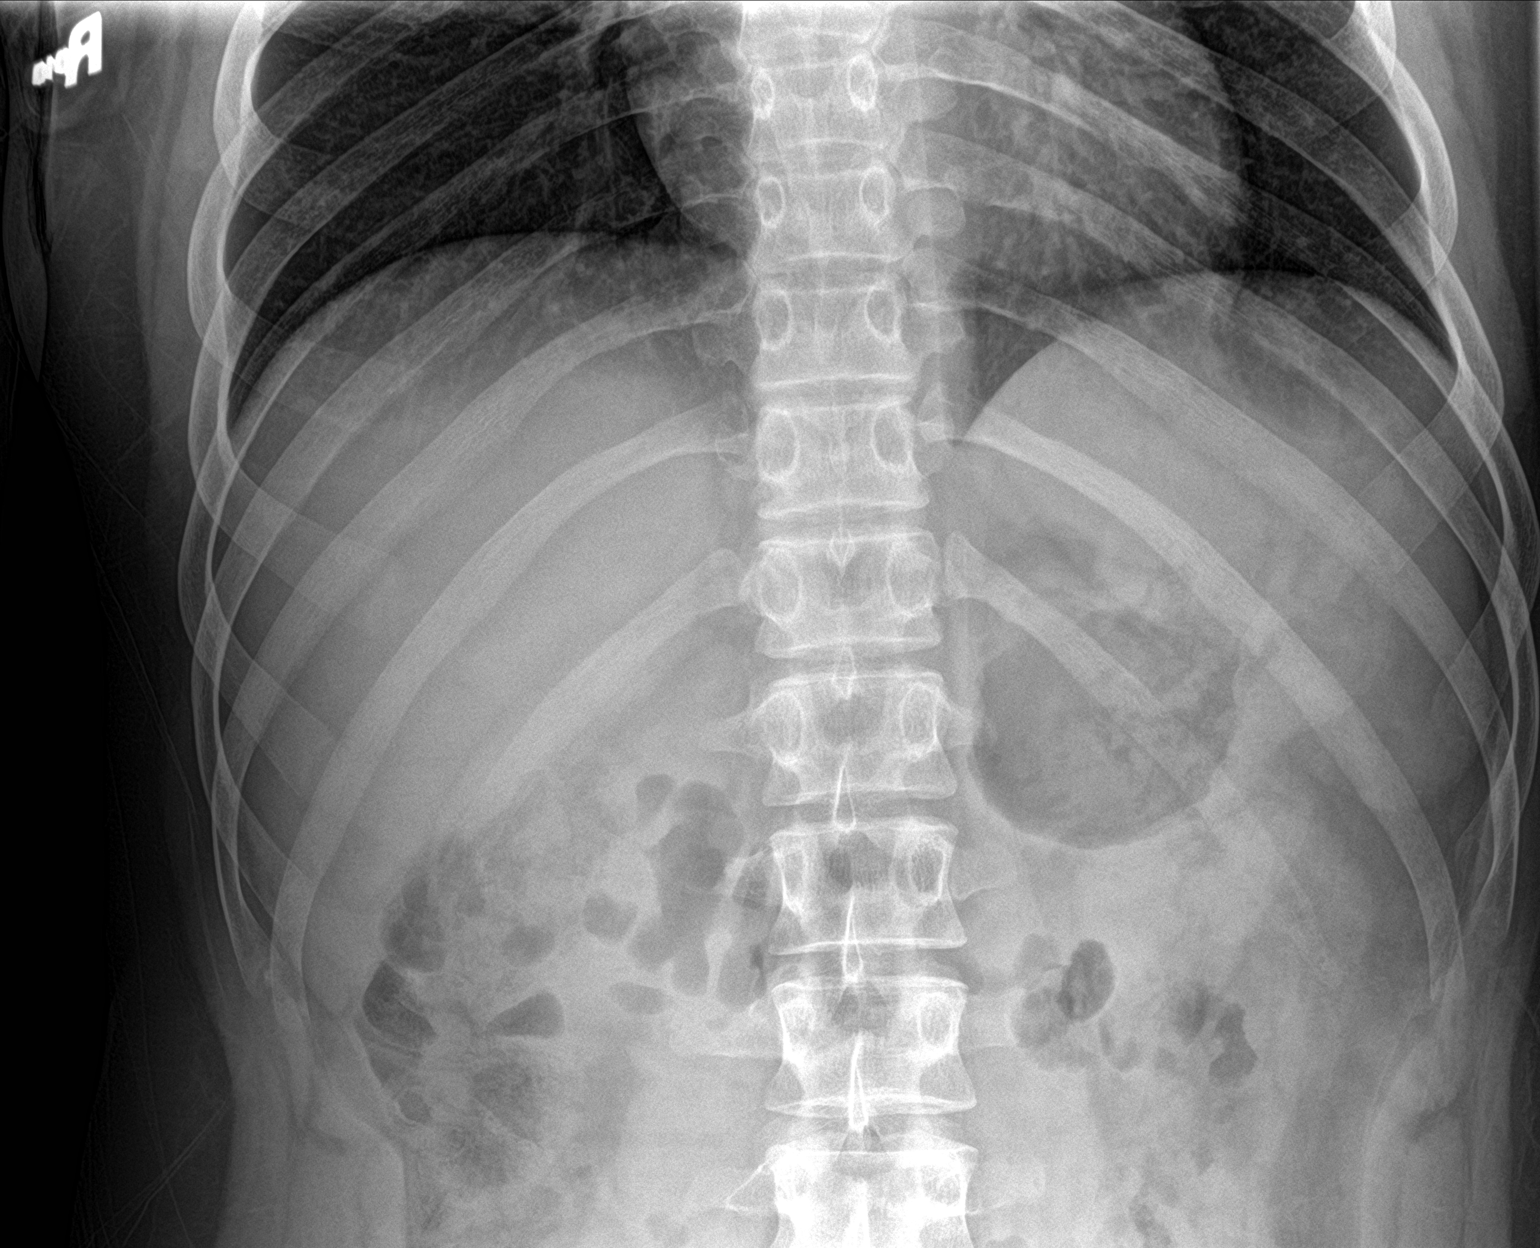

[abdomen kub (2 of 2)]
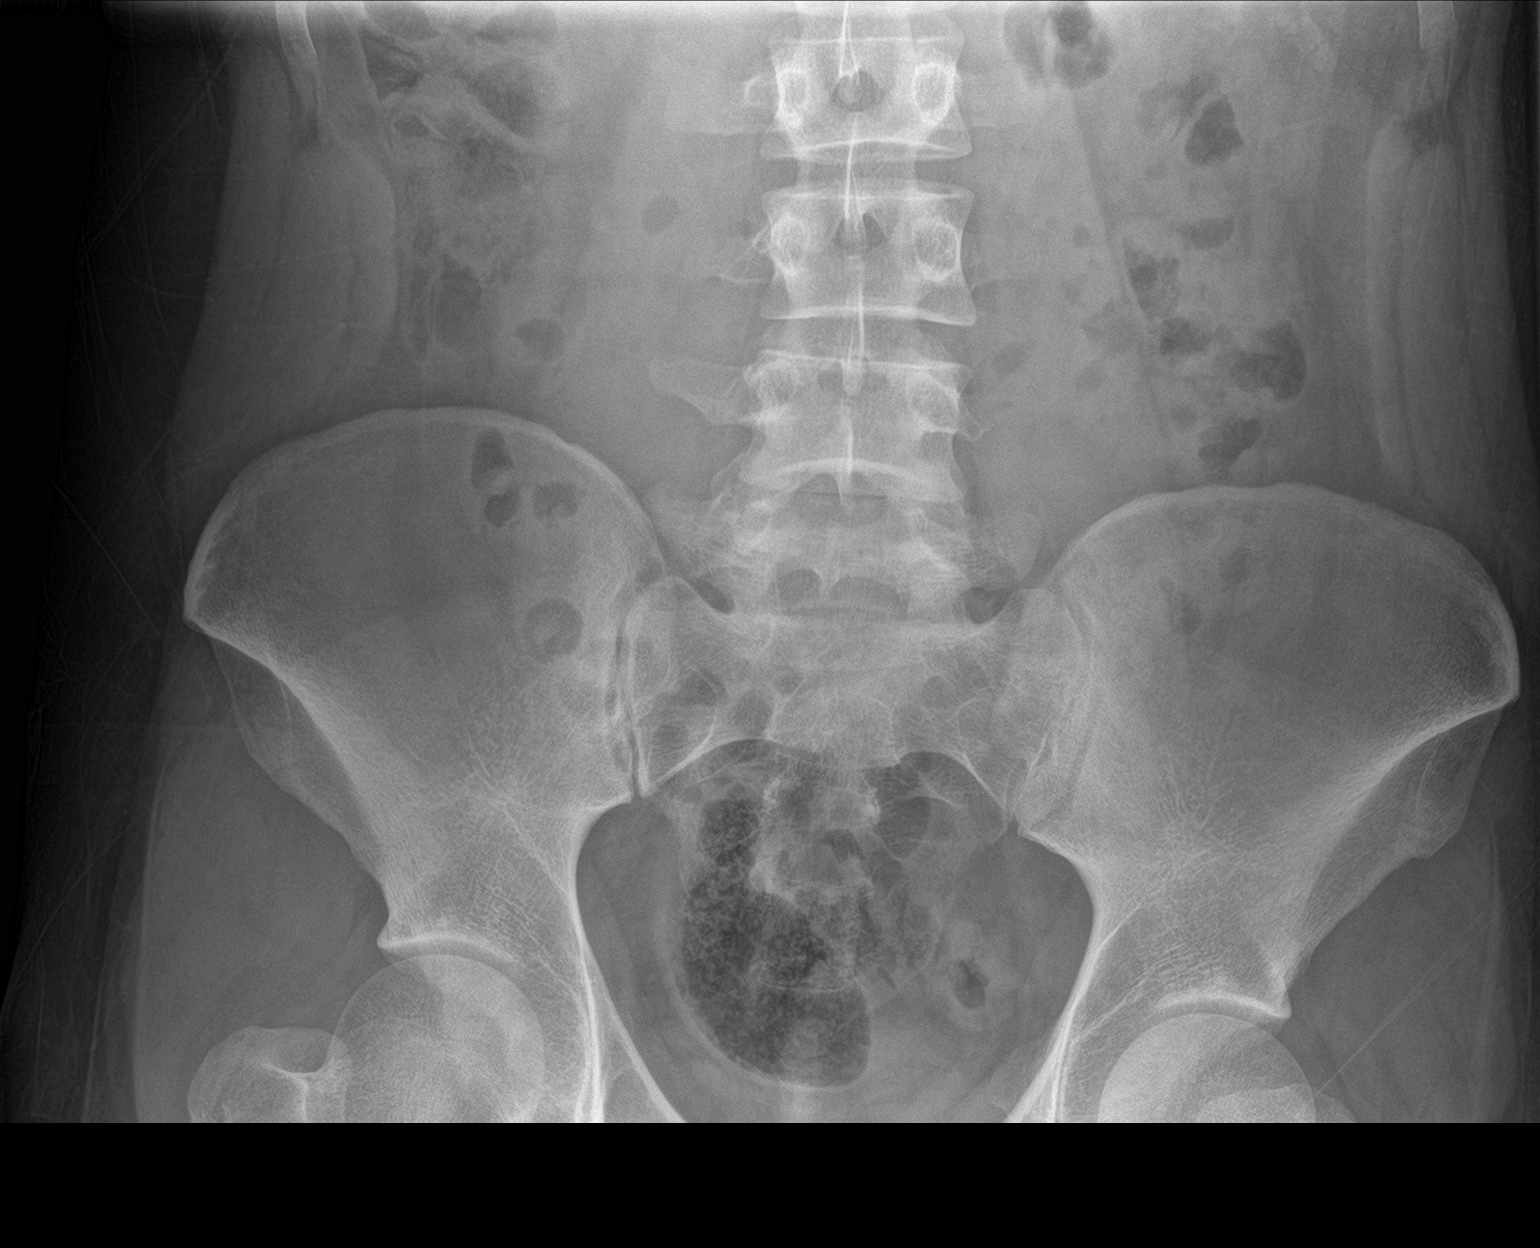

[2 of 2 positions shown; findings below may reference images not displayed]

FINDINGS: The bowel gas pattern is normal. Mild colonic stool volume. No
radio-opaque calculi or other significant radiographic abnormality
are seen. Osseous structures unremarkable.
IMPRESSION: Nonobstructive bowel gas pattern. Mild colonic stool volume.

## 2020-12-05 ENCOUNTER — Ambulatory Visit: Admit: 2020-12-05 | Payer: PRIVATE HEALTH INSURANCE | Attending: Medical

## 2020-12-05 ENCOUNTER — Encounter: Admit: 2020-12-05 | Payer: PRIVATE HEALTH INSURANCE | Attending: Medical

## 2020-12-05 MED ORDER — CETIRIZINE 10 MG TABLET
10 mg | Freq: Every day | ORAL | Status: AC
Start: 2020-12-05 — End: ?

## 2020-12-05 MED ORDER — FLUTICASONE PROPIONATE 50 MCG/ACTUATION NASAL SPRAY,SUSPENSION
50 mcg/actuation | Freq: Every day | NASAL | Status: AC
Start: 2020-12-05 — End: ?

## 2020-12-06 DIAGNOSIS — N50812 Left testicular pain: Secondary | ICD-10-CM

## 2020-12-06 DIAGNOSIS — R1032 Left lower quadrant pain: Secondary | ICD-10-CM

## 2021-01-01 ENCOUNTER — Encounter: Admit: 2021-01-01 | Payer: PRIVATE HEALTH INSURANCE | Attending: Medical

## 2021-01-01 DIAGNOSIS — K589 Irritable bowel syndrome without diarrhea: Secondary | ICD-10-CM

## 2021-01-01 NOTE — Progress Notes
New Patient NoteHPIBrendan West is a 25 y.o. male  who presents with left testicular pain.Testicular painPatient complains of left testicular pain. Onset of pain was 3-4 years ago and was gradual in onset. He rates the pain at 7/10 at worst and is dull in nature. The pain is worse at the end of the work day. The pain radiates down left inguinal area. The painis usually relieved by Ibuprofen. The pain is not exacerbated by sexual activity. The pain is not exacerbated by sitting or standing. The pain is not exacerbated by exercise. The patient does wear supportive underwear. No other identifiable inciting or alleviating factors. There is not a history of GU surgery. There is not a history of trauma. Other urologic symptoms include: . Patient admits to history of no risk factors for cancer. Patient denies history of GU surgeries, sexually transmitted diseases, tobacco use, trauma and urolithiasis. Prior workup has been Labs (BMP or CMP), UA'S, [place]. In [facility] ED - scrotal US (small varicocele) and abd/pelvis [place] w contrast reportedly negative in 2018. He was discharged from ED without tx or referral. Varicocele We discussed the etiology, symptoms and treatment for his varicocele. A varicocele is a varicose vein of the testicle and scrotum that may cause pain, testicular atrophy or fertility problems. Veins contain one-way valves that work to allow blood to flow from the testicles and scrotum back to the heart. When these valves fail, the blood pools and enlarges the veins around the testicle in the scrotum to cause a varicocele. Open surgical ligation, performed by a urologist, is the most common treatment for symptomatic varicoceles. Varicocele embolization, a non-surgical treatment performed by an interventional radiologist, is a highly effective, widely available technique to treat symptomatic varicoceles.  Symptoms? Pain - aching pain when an individual has been standing or sitting for long periods of time and pressure builds up on the affected veins. Typically, painful varicoceles are prominent in size. ? Fertility Problems - There is an association between varicoceles and infertility. The incidence of varicocele increases to 30 percent in infertile couples. Decreased sperm count, decreased motility of sperm, and an increase in the number of deformed sperm are related to varicoceles. Some experts believe that varicoceles cause infertility by raising the temperature in the scrotum and decreasing sperm production. ? Testicular Atrophy - Shrinking of the testicles is another sign of varicoceles. Often, once the testicle is repaired it will return to normal size. Past Medical HistoryNo past medical history on file.Past Surgical HistoryNo past surgical history on file.AllergiesNot on FileMedicationsOutpatient Encounter Medications as of 12/05/2020 Medication Sig Dispense Refill ? cetirizine (ZYRTEC) 10 mg tablet Take 10 mg by mouth daily.   ? fluticasone propionate (FLONASE) 50 mcg/actuation nasal spray 1 spray by Nasal route daily.   No facility-administered encounter medications on file as of 12/05/2020.  Social HistorySocial History Tobacco Use ? Smoking status: Smoker, Current Status Unknown ? Tobacco comment: vapor Substance Use Topics ? Alcohol use: Yes   Alcohol/week: 14.0 standard drinks   Types: 14 Cans of beer per week   Comment: weekends  Family History No family history on file.Review of Systems Constitutional: Negative for chills and fever. Respiratory: Negative for shortness of breath.  Cardiovascular: Negative for palpitations. Gastrointestinal: Negative for abdominal distention, abdominal pain, constipation and diarrhea. Genitourinary: Positive for scrotal swelling and testicular pain. Negative for difficulty urinating, dysuria, flank pain, frequency, genital sores, hematuria, penile discharge, penile pain, penile swelling and urgency.      Left testicular and left groin pain  Physical ExamBP 124/78  - Pulse 65  - Temp 97.9 ?F (36.6 ?C)  - Resp 16  - Ht 5' 11 (1.803 m)  - Wt 85.3 kg  - BMI 26.22 kg/m? No results found for: UCOLOR, UGLUCOSE, UKETONE, USPECGRAVITY, UPH, UPROTEIN, UNITRATES, UBLOOD, ULEUKOCYTES  No results found for: PVRPOCTPhysical ExamConstitutional:     General: He is not in acute distress.   Appearance: He is not diaphoretic. Eyes:    General: No scleral icterus.   Conjunctiva/sclera: Conjunctivae normal. Cardiovascular:    Rate and Rhythm: Normal rate. Pulmonary:    Effort: Pulmonary effort is normal. No respiratory distress. Abdominal:    General: There is no distension.    Palpations: Abdomen is soft.    Tenderness: There is no abdominal tenderness. Genitourinary:   Comments: GU: Normal circumcised phallus, no lesions or palpable urethral masses, normal urethral meatusBilaterally descended testes without tenderness or masses, (+) grade 1-2 left varicoceleDRE: deferredNeurological:    Mental Status: He is alert.  09/22/2016 11:45 PM EDT? CLINICAL DATA: ?Mobile mass LEFT scrotum for 2 days.EXAM:SCROTAL ULTRASOUNDDOPPLER ULTRASOUND OF THE TESTICLESTECHNIQUE:Complete ultrasound examination of the testicles, epididymis, andother scrotal structures was performed. Color and spectral Dopplerultrasound were also utilized to evaluate blood flow to thetesticles.COMPARISON: ?None.FINDINGS:Right testicleMeasurements: 3.8 x 2.4 x 2.4 cm. No mass or microlithiasisvisualized.Left testicleMeasurements: 3.9 x 1.8 x 2.7 cm. No mass or microlithiasisvisualized.Right epididymis: ?Normal in size and appearance.Left epididymis: ?Normal in size and appearance.Hydrocele: ?None visualized.Varicocele: ?LEFT varicocele.Pulsed Doppler interrogation of both testes demonstrates normal lowresistance arterial and venous waveforms bilaterally.IMPRESSION:LEFT varicocele. ?Otherwise negative scrotal sonogram.Electronically Signed?By: Pernell Dupre ?Bloomer M.D.?On: 09/22/2016 23:45?  Assessment & Plan:Kenneth West is a 25 y.o. male who presents with left testicular pain likely related to left sided varicocele. We discussed surgical interventions vs conservative options. I recommend to repeat scrotal US to update the size of varicocele.I spent 25 minutes of a total visit time of 29 minutes coordinating care and in counseling with Fabienne Bruns regarding diagnosis, prognosis and treatment options for: .Encounter Diagnoses Name SNOMED [place](R) Primary? ? Left testicular pain PAIN OF LEFT TESTICLE Yes  Risks, benefits and alternatives to the above treatment were discussed in detail.All questions were answered to the patient's satisfactionLeonid Rica Heather, PA
